# Patient Record
Sex: Female | Born: 1965 | Race: White | Hispanic: No | Smoking: Former smoker
Health system: Southern US, Community
[De-identification: ages and names within clinical notes are randomized; demographics above are authoritative.]

## PROBLEM LIST (undated history)

## (undated) DIAGNOSIS — M1990 Unspecified osteoarthritis, unspecified site: Secondary | ICD-10-CM

## (undated) DIAGNOSIS — D219 Benign neoplasm of connective and other soft tissue, unspecified: Secondary | ICD-10-CM

## (undated) HISTORY — DX: Unspecified osteoarthritis, unspecified site: M19.90

## (undated) HISTORY — PX: OTHER SURGICAL HISTORY: SHX169

## (undated) HISTORY — PX: PELVIC LAPAROSCOPY: SHX162

## (undated) HISTORY — DX: Benign neoplasm of connective and other soft tissue, unspecified: D21.9

---

## 1998-08-25 ENCOUNTER — Other Ambulatory Visit: Admission: RE | Admit: 1998-08-25 | Discharge: 1998-08-25 | Payer: Self-pay | Admitting: Gynecology

## 2000-05-16 ENCOUNTER — Ambulatory Visit (HOSPITAL_COMMUNITY): Admission: RE | Admit: 2000-05-16 | Discharge: 2000-05-16 | Payer: Self-pay | Admitting: Gynecology

## 2000-05-16 ENCOUNTER — Encounter: Payer: Self-pay | Admitting: Gynecology

## 2000-05-16 ENCOUNTER — Other Ambulatory Visit: Admission: RE | Admit: 2000-05-16 | Discharge: 2000-05-16 | Payer: Self-pay | Admitting: Gynecology

## 2000-05-19 ENCOUNTER — Encounter: Admission: RE | Admit: 2000-05-19 | Discharge: 2000-05-19 | Payer: Self-pay | Admitting: Gynecology

## 2000-05-19 ENCOUNTER — Encounter: Payer: Self-pay | Admitting: Gynecology

## 2001-05-16 ENCOUNTER — Other Ambulatory Visit: Admission: RE | Admit: 2001-05-16 | Discharge: 2001-05-16 | Payer: Self-pay | Admitting: Gynecology

## 2001-12-26 HISTORY — PX: CERVICAL BIOPSY  W/ LOOP ELECTRODE EXCISION: SUR135

## 2002-09-02 ENCOUNTER — Other Ambulatory Visit: Admission: RE | Admit: 2002-09-02 | Discharge: 2002-09-02 | Payer: Self-pay | Admitting: Gynecology

## 2003-02-28 ENCOUNTER — Other Ambulatory Visit: Admission: RE | Admit: 2003-02-28 | Discharge: 2003-02-28 | Payer: Self-pay | Admitting: Gynecology

## 2003-11-10 ENCOUNTER — Other Ambulatory Visit: Admission: RE | Admit: 2003-11-10 | Discharge: 2003-11-10 | Payer: Self-pay | Admitting: Gynecology

## 2004-11-11 ENCOUNTER — Other Ambulatory Visit: Admission: RE | Admit: 2004-11-11 | Discharge: 2004-11-11 | Payer: Self-pay | Admitting: Gynecology

## 2005-03-22 ENCOUNTER — Ambulatory Visit (HOSPITAL_COMMUNITY): Admission: RE | Admit: 2005-03-22 | Discharge: 2005-03-22 | Payer: Self-pay | Admitting: Gynecology

## 2006-10-23 ENCOUNTER — Ambulatory Visit (HOSPITAL_COMMUNITY): Admission: RE | Admit: 2006-10-23 | Discharge: 2006-10-23 | Payer: Self-pay | Admitting: Gynecology

## 2008-04-22 ENCOUNTER — Other Ambulatory Visit: Admission: RE | Admit: 2008-04-22 | Discharge: 2008-04-22 | Payer: Self-pay | Admitting: Family Medicine

## 2008-11-24 ENCOUNTER — Ambulatory Visit: Payer: Self-pay | Admitting: Gynecology

## 2009-09-11 ENCOUNTER — Encounter: Payer: Self-pay | Admitting: Women's Health

## 2009-09-11 ENCOUNTER — Other Ambulatory Visit: Admission: RE | Admit: 2009-09-11 | Discharge: 2009-09-11 | Payer: Self-pay | Admitting: Gynecology

## 2009-09-11 ENCOUNTER — Ambulatory Visit: Payer: Self-pay | Admitting: Women's Health

## 2009-09-25 ENCOUNTER — Ambulatory Visit: Payer: Self-pay | Admitting: Gynecology

## 2010-11-12 ENCOUNTER — Ambulatory Visit: Payer: Self-pay | Admitting: Women's Health

## 2010-11-12 ENCOUNTER — Other Ambulatory Visit: Admission: RE | Admit: 2010-11-12 | Discharge: 2010-11-12 | Payer: Self-pay | Admitting: Gynecology

## 2010-11-29 ENCOUNTER — Ambulatory Visit
Admission: RE | Admit: 2010-11-29 | Discharge: 2010-11-29 | Payer: Self-pay | Source: Home / Self Care | Admitting: Gynecology

## 2011-05-12 ENCOUNTER — Ambulatory Visit (HOSPITAL_COMMUNITY)
Admission: RE | Admit: 2011-05-12 | Discharge: 2011-05-12 | Disposition: A | Discharge status: Home / Self Care | Payer: PRIVATE HEALTH INSURANCE | Source: Ambulatory Visit | Attending: Gynecology | Admitting: Gynecology

## 2011-05-12 ENCOUNTER — Other Ambulatory Visit: Payer: Self-pay | Admitting: Gynecology

## 2011-05-12 DIAGNOSIS — Z1231 Encounter for screening mammogram for malignant neoplasm of breast: Secondary | ICD-10-CM | POA: Insufficient documentation

## 2011-07-02 IMAGING — CR DG PELVIS 1-2V
1 series · 1 of 1 positions shown · non-contrast
Comparison: None

CLINICAL DATA: Low back pain

PELVIS - 1-2 VIEW

[view not recorded]
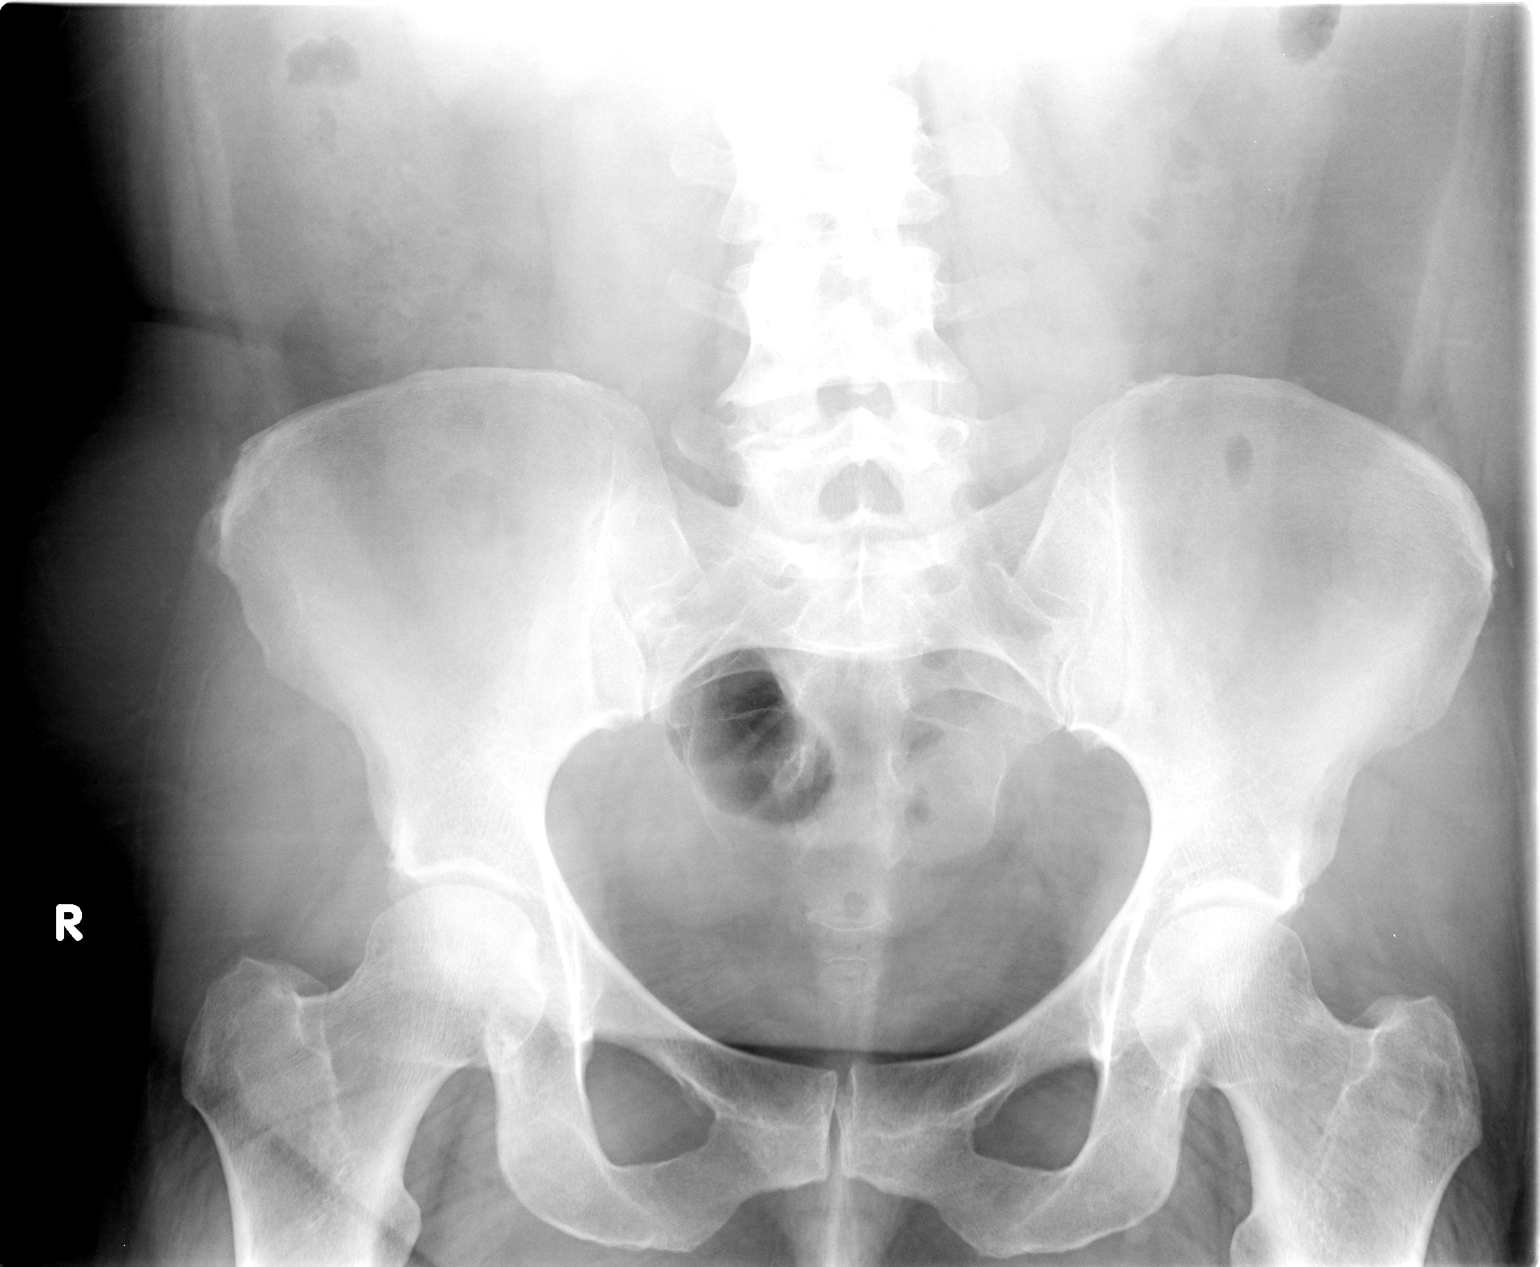

[1 of 1 positions shown; findings below may reference images not displayed]

FINDINGS: Both hips are in normal alignment.  No significant
degenerative change.  The SI joints are normal bilaterally.
Negative for pelvic fracture or bone lesion.
IMPRESSION: Negative

## 2011-12-09 ENCOUNTER — Encounter: Payer: Self-pay | Admitting: Women's Health

## 2011-12-09 ENCOUNTER — Ambulatory Visit (INDEPENDENT_AMBULATORY_CARE_PROVIDER_SITE_OTHER): Payer: PRIVATE HEALTH INSURANCE | Admitting: Women's Health

## 2011-12-09 ENCOUNTER — Other Ambulatory Visit (HOSPITAL_COMMUNITY)
Admission: RE | Admit: 2011-12-09 | Discharge: 2011-12-09 | Disposition: A | Discharge status: Home / Self Care | Payer: PRIVATE HEALTH INSURANCE | Source: Ambulatory Visit | Attending: Women's Health | Admitting: Women's Health

## 2011-12-09 VITALS — BP 120/80 | Ht 64.0 in | Wt 230.0 lb

## 2011-12-09 DIAGNOSIS — Z13220 Encounter for screening for lipoid disorders: Secondary | ICD-10-CM

## 2011-12-09 DIAGNOSIS — Z833 Family history of diabetes mellitus: Secondary | ICD-10-CM

## 2011-12-09 DIAGNOSIS — Z01419 Encounter for gynecological examination (general) (routine) without abnormal findings: Secondary | ICD-10-CM

## 2011-12-09 DIAGNOSIS — R82998 Other abnormal findings in urine: Secondary | ICD-10-CM

## 2011-12-09 DIAGNOSIS — F411 Generalized anxiety disorder: Secondary | ICD-10-CM

## 2011-12-09 DIAGNOSIS — F419 Anxiety disorder, unspecified: Secondary | ICD-10-CM

## 2011-12-09 MED ORDER — ALPRAZOLAM 0.25 MG PO TABS: 0.2500 mg | ORAL_TABLET | Freq: Every evening | ORAL | Status: DC | PRN

## 2011-12-09 NOTE — Progress Notes (Signed)
[facility] 27-Oct-1979 [id number]    History:    The patient presents for annual exam.  No complaints.   Past medical history, past surgical history, family history and social history were all reviewed and documented in the EPIC chart. 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 History of father with colon cancer. History of normal mammograms. Smoke-free greater than 6 months.  ROS:  A  ROS was performed and pertinent positives and negatives are included in the history.  Exam:  Filed Vitals:   12/09/11 0905  BP: 120/80    General appearance:  Normal Head/Neck:  Normal, without cervical or supraclavicular adenopathy. Thyroid:  Symmetrical, normal in size, without palpable masses or nodularity. Respiratory  Effort:  Normal  Auscultation:  Clear without wheezing or rhonchi Cardiovascular  Auscultation:  Regular rate, without rubs, murmurs or gallops  Edema/varicosities:  Not grossly evident Abdominal  Soft,nontender, without masses, guarding or rebound.  Liver/spleen:  No organomegaly noted  Hernia:  None appreciated  Skin  Inspection:  Grossly normal  Palpation:  Grossly normal Neurologic/psychiatric  Orientation:  Normal with appropriate conversation.  Mood/affect:  Normal  Genitourinary    Breasts: Examined lying and sitting.     Right: Without masses, retractions, discharge or axillary adenopathy.     Left: Without masses, retractions, discharge or axillary adenopathy.   Inguinal/mons:  Normal without inguinal adenopathy  External genitalia:  Normal  BUS/Urethra/Skene's glands:  Normal  Bladder:  Normal  Vagina:  Normal  Cervix:  Normal/stenotic  Uterus:  normal in size, shape and contour.  Midline and mobile  Adnexa/parametria:     Rt: Without masses or tenderness.   Lt: Without masses or tenderness.  Anus and perineum: Normal  Digital rectal exam: Normal sphincter tone without palpated masses or tenderness  Assessment/Plan:  45 y.o. MWF G1 P0  for  annual exam. Monthly 3 day cycle/not sexually active.  Normal GYN exam Father history of colon cancer Situational stressors/anxiety/impending divorce  Plan: Schedule colonoscopy. SBEs, annual screening, exercise, calcium rich diet, vitamin D 1000 ,continue to decrease calories for weight loss encouraged. Xanax 0.25 every 8 hours when necessary and for rest #30 one refill. Aware to use sparingly/addictive. Declines need for counseling at this time. CBC, glucose, lipid profile, TSH, UA and Pap.   Harrington Chall[facility], 9:48 AM 12/09/2011

## 2012-05-02 ENCOUNTER — Other Ambulatory Visit: Payer: Self-pay | Admitting: Women's Health

## 2012-05-02 DIAGNOSIS — J069 Acute upper respiratory infection, unspecified: Secondary | ICD-10-CM

## 2012-05-02 MED ORDER — AMOXICILLIN-POT CLAVULANATE 500-125 MG PO TABS: ORAL_TABLET | ORAL | Status: DC

## 2012-05-02 NOTE — Progress Notes (Signed)
Telephone call with complaint of sore throat, upper respiratory symptoms with productive cough. History of tonsillitis in the past, has quit smoking and has done better

## 2012-08-02 ENCOUNTER — Other Ambulatory Visit: Payer: Self-pay | Admitting: Gynecology

## 2012-08-02 ENCOUNTER — Ambulatory Visit (HOSPITAL_COMMUNITY)
Admission: RE | Admit: 2012-08-02 | Discharge: 2012-08-02 | Disposition: A | Discharge status: Home / Self Care | Payer: PRIVATE HEALTH INSURANCE | Source: Ambulatory Visit | Attending: Gynecology | Admitting: Gynecology

## 2012-08-02 DIAGNOSIS — Z0000 Encounter for general adult medical examination without abnormal findings: Secondary | ICD-10-CM

## 2012-08-02 DIAGNOSIS — Z1231 Encounter for screening mammogram for malignant neoplasm of breast: Secondary | ICD-10-CM | POA: Insufficient documentation

## 2012-12-21 ENCOUNTER — Encounter: Payer: Self-pay | Admitting: Women's Health

## 2014-05-30 ENCOUNTER — Other Ambulatory Visit: Payer: Self-pay | Admitting: Women's Health

## 2014-05-30 ENCOUNTER — Encounter: Payer: Self-pay | Admitting: Women's Health

## 2014-05-30 MED ORDER — VITAMIN D (ERGOCALCIFEROL) 1.25 MG (50000 UNIT) PO CAPS: 50000.0000 [IU] | ORAL_CAPSULE | ORAL | Status: DC

## 2014-06-13 ENCOUNTER — Ambulatory Visit (INDEPENDENT_AMBULATORY_CARE_PROVIDER_SITE_OTHER): Payer: BC Managed Care – PPO | Admitting: Women's Health

## 2014-06-13 ENCOUNTER — Other Ambulatory Visit: Payer: Self-pay | Admitting: Gynecology

## 2014-06-13 ENCOUNTER — Other Ambulatory Visit (HOSPITAL_COMMUNITY)
Admission: RE | Admit: 2014-06-13 | Discharge: 2014-06-13 | Disposition: A | Discharge status: Home / Self Care | Payer: BC Managed Care – PPO | Source: Ambulatory Visit | Attending: Women's Health | Admitting: Women's Health

## 2014-06-13 ENCOUNTER — Encounter: Payer: Self-pay | Admitting: Women's Health

## 2014-06-13 VITALS — BP 124/74 | Ht 64.5 in | Wt 250.0 lb

## 2014-06-13 DIAGNOSIS — Z1151 Encounter for screening for human papillomavirus (HPV): Secondary | ICD-10-CM | POA: Insufficient documentation

## 2014-06-13 DIAGNOSIS — Z01419 Encounter for gynecological examination (general) (routine) without abnormal findings: Secondary | ICD-10-CM

## 2014-06-13 DIAGNOSIS — Z1231 Encounter for screening mammogram for malignant neoplasm of breast: Secondary | ICD-10-CM

## 2014-06-13 NOTE — Progress Notes (Signed)
Mary Braun 06/11/1976 [id number]    History:    Presents for annual exam.  Monthly cycle for several days less predictable. Not sexually active for years. 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 Normal mammogram history. Scheduled for gastric sleeve for weight loss in one month.  Past medical history, past surgical history, family history and social history were all reviewed and documented in the EPIC chart. Ultrasonographer. Father colon Cancer at 67, DM, hypertension , COPD, mother A fib and COPD.  ROS:  A  12 point ROS was performed and pertinent positives and negatives are included.  Exam:  Filed Vitals:   06/13/14 1131  BP: 124/74    General appearance:  Normal Thyroid:  Symmetrical, normal in size, without palpable masses or nodularity. Respiratory  Auscultation:  Clear without wheezing or rhonchi Cardiovascular  Auscultation:  Regular rate, without rubs, murmurs or gallops  Edema/varicosities:  Not grossly evident Abdominal  Soft,nontender, without masses, guarding or rebound.  Liver/spleen:  No organomegaly noted  Hernia:  None appreciated  Skin  Inspection:  Grossly normal   Breasts: Examined lying and sitting.     Right: Without masses, retractions, discharge or axillary adenopathy.     Left: Without masses, retractions, discharge or axillary adenopathy. Gentitourinary   Inguinal/mons:  Normal without inguinal adenopathy  External genitalia:  Normal  BUS/Urethra/Skene's glands:  Normal  Vagina:  Normal  Cervix:  Normal  Uterus:  normal in size, shape and contour.  Midline and mobile  Adnexa/parametria:     Rt: Without masses or tenderness.   Lt: Without masses or tenderness.  Anus and perineum: Normal  Digital rectal exam: Normal sphincter tone without palpated masses or tenderness  Assessment/Plan:  48 y.o. DWF G0 for annual exam with no complaints.  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 Labs primary care in preparation for gastric sleeve reviewed, will repeat 6 months after weight loss. Pap with HR HPV typing. Screening colonoscopy 48.   Note: This dictation was prepared with Dragon/digital dictation.  Any transcriptional errors that result are unintentional. Huel [facility], 12:04 PM 06/13/2014

## 2014-06-13 NOTE — Patient Instructions (Signed)

## 2014-06-13 NOTE — Progress Notes (Deleted)
Patient ID: Mary Braun, female   DOB: [birth year], 48 y.o.   MRN: [id number]

## 2014-06-14 LAB — URINALYSIS W MICROSCOPIC + REFLEX CULTURE
BILIRUBIN URINE: NEGATIVE
CRYSTALS: NONE SEEN
Casts: NONE SEEN
Glucose, UA: NEGATIVE mg/dL
HGB URINE DIPSTICK: NEGATIVE
KETONES UR: NEGATIVE mg/dL
Leukocytes, UA: NEGATIVE
NITRITE: NEGATIVE
Protein, ur: NEGATIVE mg/dL
SPECIFIC GRAVITY, URINE: 1.02 (ref 1.005–1.030)
Squamous Epithelial / LPF: NONE SEEN
Urobilinogen, UA: 0.2 mg/dL (ref 0.0–1.0)
pH: 7 (ref 5.0–8.0)

## 2014-06-15 LAB — URINE CULTURE: Colony Count: 50000

## 2014-06-17 LAB — CYTOLOGY - PAP

## 2014-06-26 ENCOUNTER — Other Ambulatory Visit: Payer: Self-pay | Admitting: *Deleted

## 2014-06-26 ENCOUNTER — Other Ambulatory Visit: Payer: Self-pay | Admitting: Family Medicine

## 2014-06-26 ENCOUNTER — Ambulatory Visit (INDEPENDENT_AMBULATORY_CARE_PROVIDER_SITE_OTHER): Payer: BC Managed Care – PPO

## 2014-06-26 DIAGNOSIS — Z01811 Encounter for preprocedural respiratory examination: Secondary | ICD-10-CM

## 2014-06-26 DIAGNOSIS — Z1231 Encounter for screening mammogram for malignant neoplasm of breast: Secondary | ICD-10-CM

## 2014-10-07 ENCOUNTER — Other Ambulatory Visit: Payer: Self-pay

## 2014-10-07 NOTE — Telephone Encounter (Signed)
Ok to refill, but have her check vit d level.

## 2014-10-08 ENCOUNTER — Other Ambulatory Visit: Payer: BC Managed Care – PPO

## 2014-10-08 ENCOUNTER — Other Ambulatory Visit: Payer: Self-pay | Admitting: *Deleted

## 2014-10-08 DIAGNOSIS — E559 Vitamin D deficiency, unspecified: Secondary | ICD-10-CM

## 2014-10-08 MED ORDER — VITAMIN D (ERGOCALCIFEROL) 1.25 MG (50000 UNIT) PO CAPS: 50000.0000 [IU] | ORAL_CAPSULE | ORAL | Status: DC

## 2014-10-08 NOTE — Telephone Encounter (Signed)
I called Pam and left a message regarding needs to have Vit D level drawn.

## 2014-10-09 ENCOUNTER — Encounter: Payer: Self-pay | Admitting: Women's Health

## 2014-10-09 LAB — VITAMIN D 25 HYDROXY (VIT D DEFICIENCY, FRACTURES): VIT D 25 HYDROXY: 45 ng/mL (ref 30–89)

## 2014-10-10 ENCOUNTER — Other Ambulatory Visit: Payer: Self-pay | Admitting: Women's Health

## 2014-10-10 MED ORDER — AMOXICILLIN 500 MG PO CAPS: 500.0000 mg | ORAL_CAPSULE | Freq: Three times a day (TID) | ORAL | Status: DC

## 2014-10-27 ENCOUNTER — Encounter: Payer: Self-pay | Admitting: Women's Health

## 2015-01-27 IMAGING — CR DG CHEST 2V
2 series · 2 of 2 positions shown · non-contrast
Comparison: None.

CLINICAL DATA: Preoperative chest x-ray.

EXAM:
CHEST  2 VIEW

[view not recorded (1 of 2)]
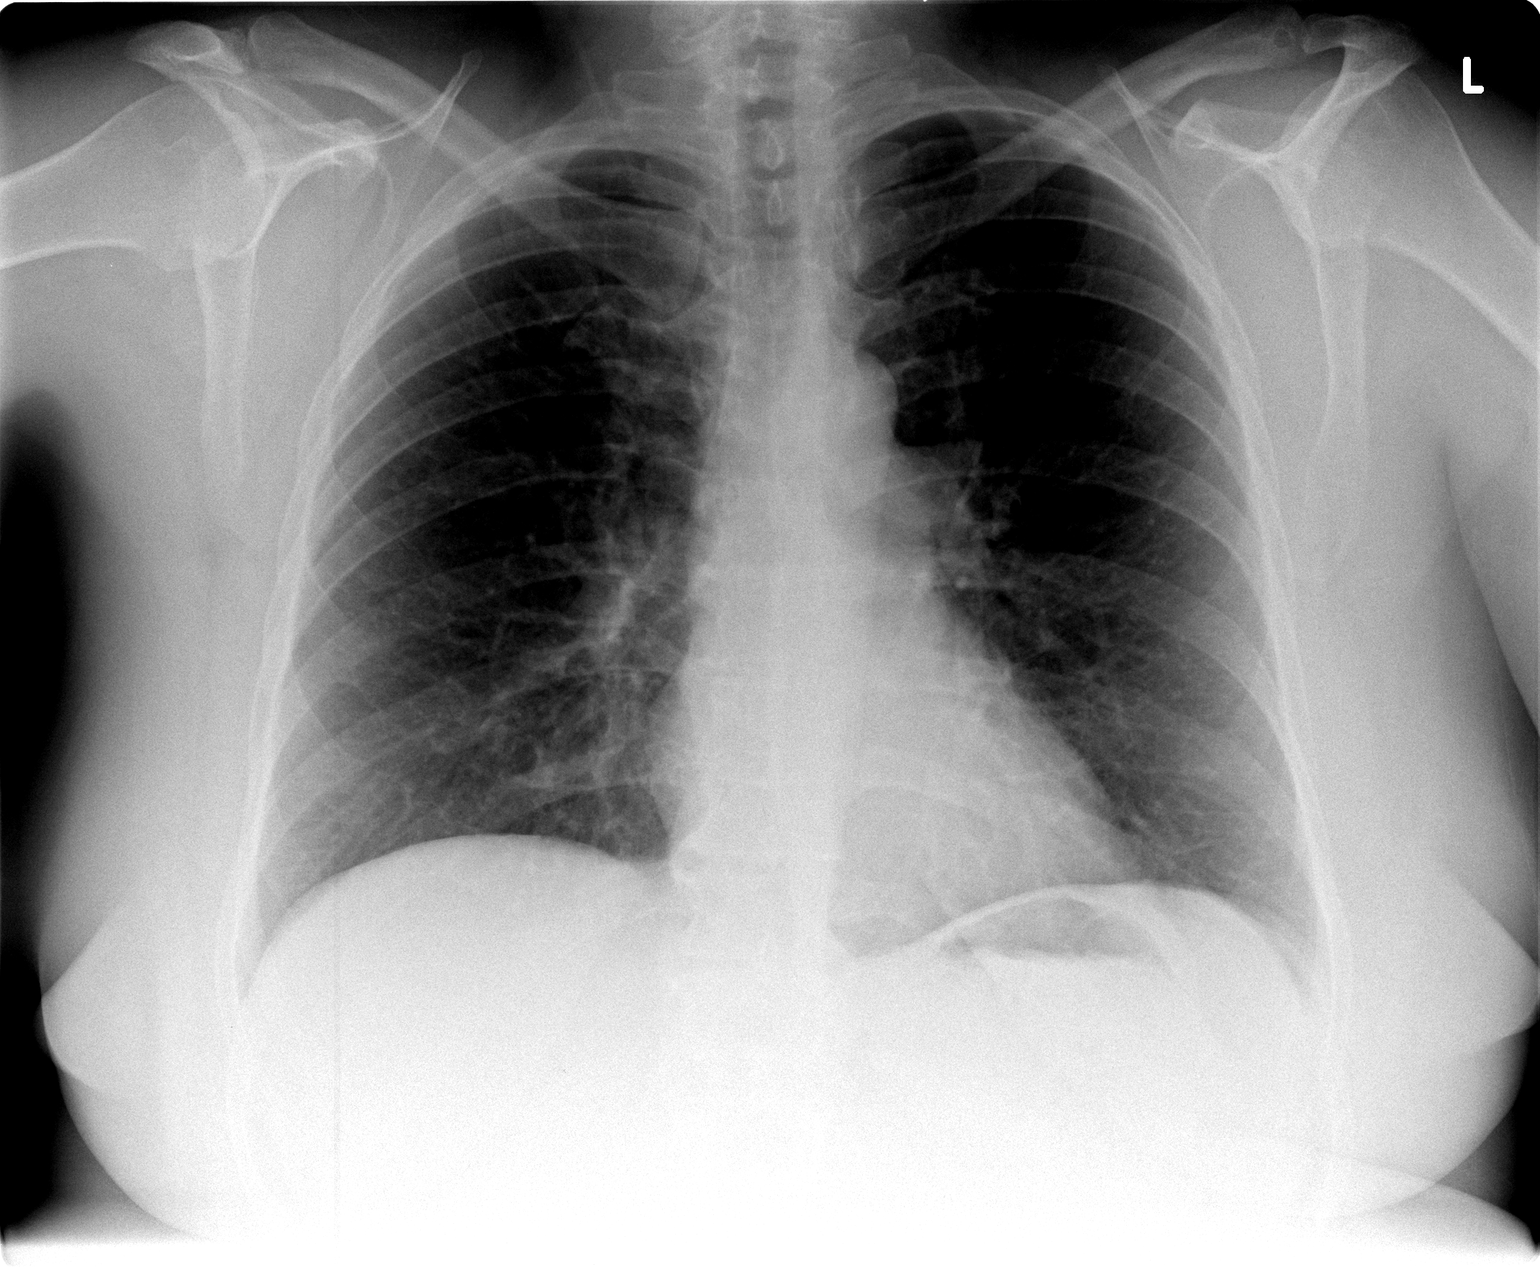

[view not recorded (2 of 2)]
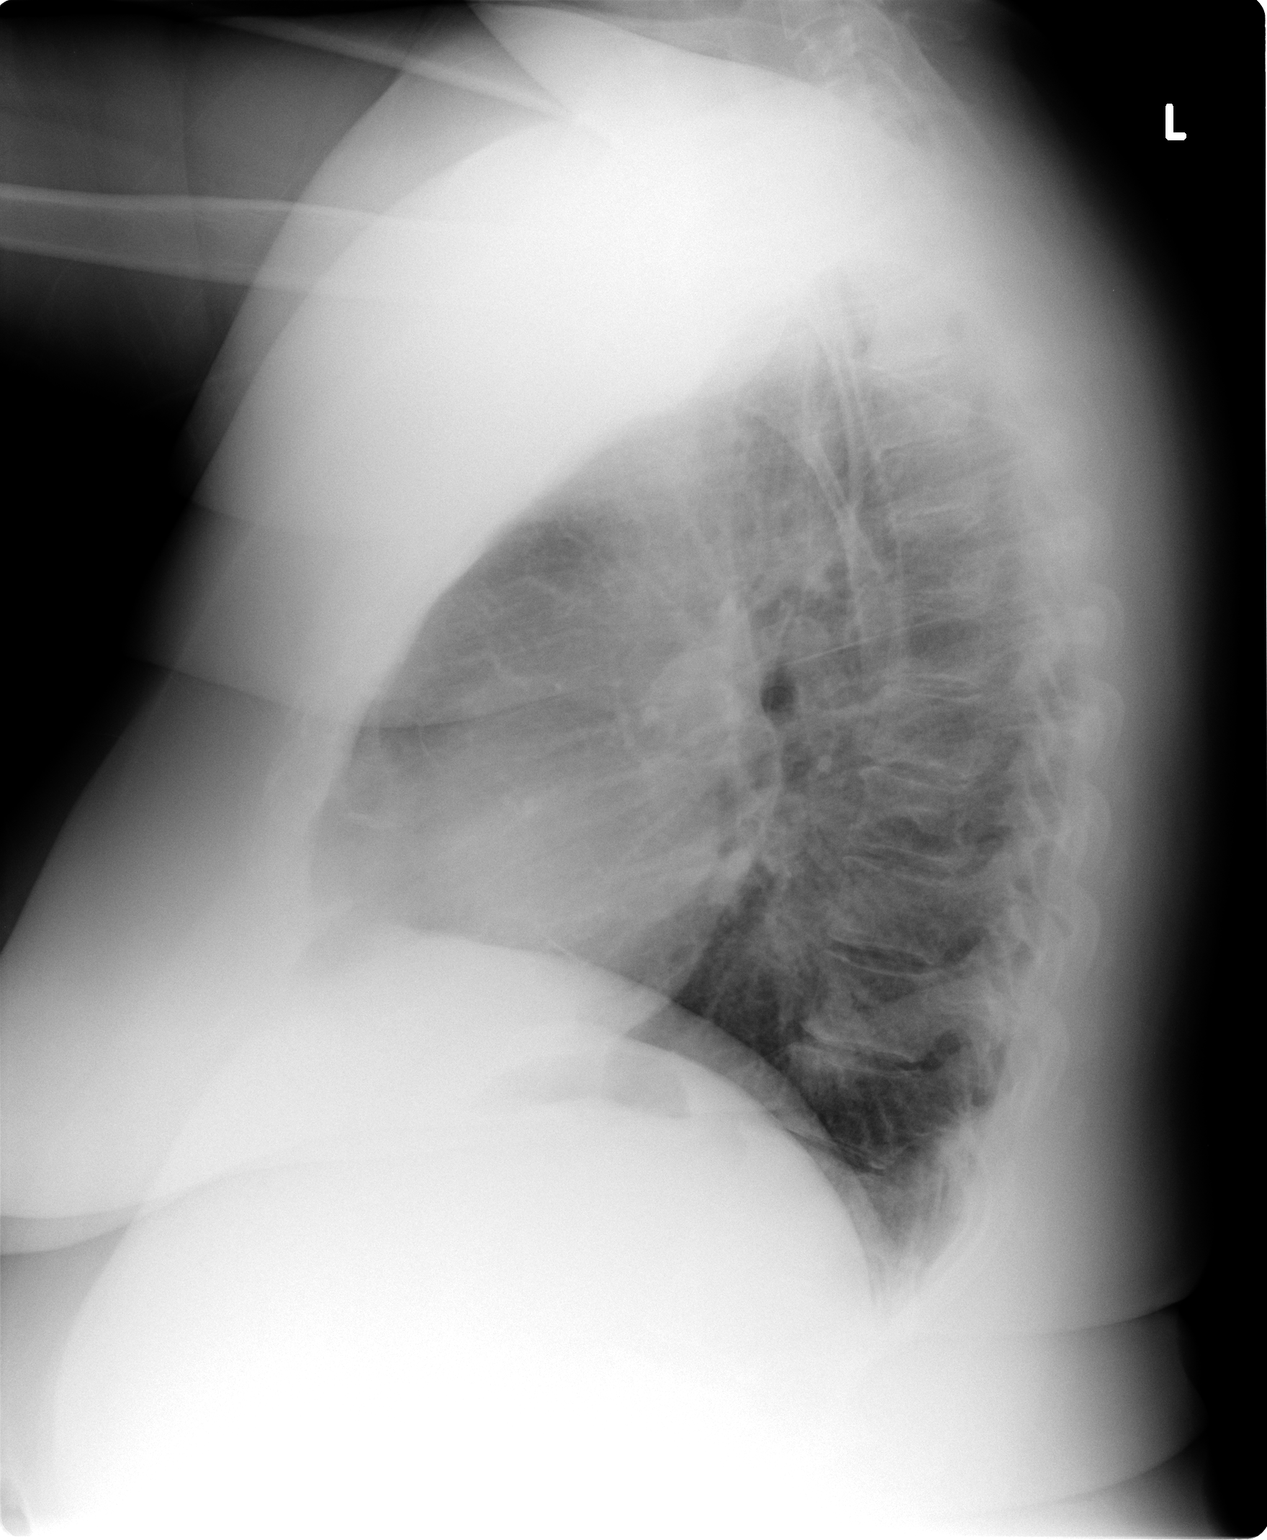

[2 of 2 positions shown; findings below may reference images not displayed]

FINDINGS: The lungs are well-expanded and clear. The heart and mediastinal
structures are normal. There is no pleural effusion. The bony thorax
is unremarkable.
IMPRESSION: There is no active cardiopulmonary disease.

## 2015-10-02 ENCOUNTER — Encounter: Payer: Self-pay | Admitting: Women's Health

## 2015-10-02 ENCOUNTER — Ambulatory Visit (INDEPENDENT_AMBULATORY_CARE_PROVIDER_SITE_OTHER): Payer: PRIVATE HEALTH INSURANCE | Admitting: Women's Health

## 2015-10-02 VITALS — BP 112/76 | Ht 64.0 in | Wt 179.0 lb

## 2015-10-02 DIAGNOSIS — Z01419 Encounter for gynecological examination (general) (routine) without abnormal findings: Secondary | ICD-10-CM

## 2015-10-02 DIAGNOSIS — G4700 Insomnia, unspecified: Secondary | ICD-10-CM | POA: Diagnosis not present

## 2015-10-02 DIAGNOSIS — E559 Vitamin D deficiency, unspecified: Secondary | ICD-10-CM

## 2015-10-02 DIAGNOSIS — B009 Herpesviral infection, unspecified: Secondary | ICD-10-CM | POA: Diagnosis not present

## 2015-10-02 MED ORDER — VITAMIN D (ERGOCALCIFEROL) 1.25 MG (50000 UNIT) PO CAPS: 50000.0000 [IU] | ORAL_CAPSULE | ORAL | Status: DC

## 2015-10-02 MED ORDER — ZOLPIDEM TARTRATE 10 MG PO TABS: 10.0000 mg | ORAL_TABLET | Freq: Every evening | ORAL | Status: DC | PRN

## 2015-10-02 MED ORDER — VALACYCLOVIR HCL 500 MG PO TABS: ORAL_TABLET | ORAL | Status: DC

## 2015-10-02 NOTE — Patient Instructions (Signed)
Health Maintenance, Female Adopting a healthy lifestyle and getting preventive care can go a long way to promote health and wellness. Talk with your health care provider about what schedule of regular examinations is right for you. This is a good chance for you to check in with your provider about disease prevention and staying healthy. In between checkups, there are plenty of things you can do on your own. Experts have done a lot of research about which lifestyle changes and preventive measures are most likely to keep you healthy. Ask your health care provider for more information. WEIGHT AND DIET  Eat a healthy diet Be sure to include plenty of vegetables, fruits, low-fat dairy products, and leaInsomnia Insomnia is a sleep disorder that makes it difficult to fall asleep or to stay asleep. Insomnia can cause tiredness (fatigue), low energy, difficulty concentrating, mood swings, and poor performance at work or school.  There are three different ways to classify insomnia: Difficulty falling asleep. Difficulty staying asleep. Waking up too early in the morning. Any type of insomnia can be long-term (chronic) or short-term (acute). Both are common. Short-term insomnia usually lasts for three months or less. Chronic insomnia occurs at least three times a week for longer than three months. CAUSES  Insomnia may be caused by another condition, situation, or substance, such as: Anxiety. Certain medicines. Gastroesophageal reflux disease (GERD) or other gastrointestinal conditions. Asthma or other breathing conditions. Restless legs syndrome, sleep apnea, or other sleep disorders. Chronic pain. Menopause. This may include hot flashes. Stroke. Abuse of alcohol, tobacco, or illegal drugs. Depression. Caffeine.  Neurological disorders, such as Alzheimer disease. An overactive thyroid (hyperthyroidism). The cause of insomnia may not be known. RISK FACTORS Risk factors for insomnia  include: Gender. Women are more commonly affected than men. Age. Insomnia is more common as you get older. Stress. This may involve your professional or personal life. Income. Insomnia is more common in people with lower income. Lack of exercise.  Irregular work schedule or night shifts. Traveling between different time zones. SIGNS AND SYMPTOMS If you have insomnia, trouble falling asleep or trouble staying asleep is the main symptom. This may lead to other symptoms, such as: Feeling fatigued. Feeling nervous about going to sleep. Not feeling rested in the morning. Having trouble concentrating. Feeling irritable, anxious, or depressed. TREATMENT  Treatment for insomnia depends on the cause. If your insomnia is caused by an underlying condition, treatment will focus on addressing the condition. Treatment may also include:  Medicines to help you sleep. Counseling or therapy. Lifestyle adjustments. HOME CARE INSTRUCTIONS  Take medicines only as directed by your health care provider. Keep regular sleeping and waking hours. Avoid naps. Keep a sleep diary to help you and your health care provider figure out what could be causing your insomnia. Include:  When you sleep. When you wake up during the night. How well you sleep.  How rested you feel the next day. Any side effects of medicines you are taking. What you eat and drink.  Make your bedroom a comfortable place where it is easy to fall asleep: Put up shades or special blackout curtains to block light from outside. Use a white noise machine to block noise. Keep the temperature cool.  Exercise regularly as directed by your health care provider. Avoid exercising right before bedtime. Use relaxation techniques to manage stress. Ask your health care provider to suggest some techniques that may work well for you. These may include: Breathing exercises. Routines to release muscle tension. Visualizing  peaceful scenes. Cut back on  alcohol, caffeinated beverages, and cigarettes, especially close to bedtime. These can disrupt your sleep. Do not overeat or eat spicy foods right before bedtime. This can lead to digestive discomfort that can make it hard for you to sleep. Limit screen use before bedtime. This includes: Watching TV. Using your smartphone, tablet, and computer. Stick to a routine. This can help you fall asleep faster. Try to do a quiet activity, brush your teeth, and go to bed at the same time each night. Get out of bed if you are still awake after 15 minutes of trying to sleep. Keep the lights down, but try reading or doing a quiet activity. When you feel sleepy, go back to bed. Make sure that you drive carefully. Avoid driving if you feel very sleepy. Keep all follow-up appointments as directed by your health care provider. This is important. SEEK MEDICAL CARE IF:  You are tired throughout the day or have trouble in your daily routine due to sleepiness. You continue to have sleep problems or your sleep problems get worse. SEEK IMMEDIATE MEDICAL CARE IF:  You have serious thoughts about hurting yourself or someone else.   This information is not intended to replace advice given to you by your health care provider. Make sure you discuss any questions you have with your health care provider.   Document Released: 12/09/2000 Document Revised: 09/02/2015 Document Reviewed: 09/12/2014 Elsevier Interactive Patient Education [employer].  n protein.  Do not eat a lot of foods high in solid fats, added sugars, or salt.  Get regular exercise. This is one of the most important things you can do for your health.  Most adults should exercise for at least 150 minutes each week. The exercise should increase your heart rate and make you sweat (moderate-intensity exercise).  Most adults should also do strengthening exercises at least twice a week. This is in addition to the moderate-intensity exercise.  Maintain  a healthy weight  Body mass index (BMI) is a measurement that can be used to identify possible weight problems. It estimates body fat based on height and weight. Your health care provider can help determine your BMI and help you achieve or maintain a healthy weight.  For females 32 years of age and older:   A BMI below 18.5 is considered underweight.  A BMI of 18.5 to 24.9 is normal.  A BMI of 25 to 29.9 is considered overweight.  A BMI of 30 and above is considered obese.  Watch levels of cholesterol and blood lipids  You should start having your blood tested for lipids and cholesterol at 51 years of age, then have this test every 5 years.  You may need to have your cholesterol levels checked more often if:  Your lipid or cholesterol levels are high.  You are older than 51 years of age.  You are at high risk for heart disease.  CANCER SCREENING   Lung Cancer  Lung cancer screening is recommended for adults 59-18 years old who are at high risk for lung cancer because of a history of smoking.  A yearly low-dose CT scan of the lungs is recommended for people who:  Currently smoke.  Have quit within the past 15 years.  Have at least a 30-pack-year history of smoking. A pack year is smoking an average of one pack of cigarettes a day for 1 year.  Yearly screening should continue until it has been 15 years since you quit.  Yearly screening should stop if you develop a health problem that would prevent you from having lung cancer treatment.  Breast Cancer  Practice breast self-awareness. This means understanding how your breasts normally appear and feel.  It also means doing regular breast self-exams. Let your health care provider know about any changes, no matter how small.  If you are in your 20s or 30s, you should have a clinical breast exam (CBE) by a health care provider every 1-3 years as part of a regular health exam.  If you are 50 or older, have a CBE every  year. Also consider having a breast X-ray (mammogram) every year.  If you have a family history of breast cancer, talk to your health care provider about genetic screening.  If you are at high risk for breast cancer, talk to your health care provider about having an MRI and a mammogram every year.  Breast cancer gene (BRCA) assessment is recommended for women who have family members with BRCA-related cancers. BRCA-related cancers include:  Breast.  Ovarian.  Tubal.  Peritoneal cancers.  Results of the assessment will determine the need for genetic counseling and BRCA1 and BRCA2 testing. Cervical Cancer Your health care provider may recommend that you be screened regularly for cancer of the pelvic organs (ovaries, uterus, and vagina). This screening involves a pelvic examination, including checking for microscopic changes to the surface of your cervix (Pap test). You may be encouraged to have this screening done every 3 years, beginning at age 24.  For women ages 60-65, health care providers may recommend pelvic exams and Pap testing every 3 years, or they may recommend the Pap and pelvic exam, combined with testing for human papilloma virus (HPV), every 5 years. Some types of HPV increase your risk of cervical cancer. Testing for HPV may also be done on women of any age with unclear Pap test results.  Other health care providers may not recommend any screening for nonpregnant women who are considered low risk for pelvic cancer and who do not have symptoms. Ask your health care provider if a screening pelvic exam is right for you.  If you have had past treatment for cervical cancer or a condition that could lead to cancer, you need Pap tests and screening for cancer for at least 20 years after your treatment. If Pap tests have been discontinued, your risk factors (such as having a new sexual partner) need to be reassessed to determine if screening should resume. Some women have medical  problems that increase the chance of getting cervical cancer. In these cases, your health care provider may recommend more frequent screening and Pap tests. Colorectal Cancer  This type of cancer can be detected and often prevented.  Routine colorectal cancer screening usually begins at 51 years of age and continues through 51 years of age.  Your health care provider may recommend screening at an earlier age if you have risk factors for colon cancer.  Your health care provider may also recommend using home test kits to check for hidden blood in the stool.  A small camera at the end of a tube can be used to examine your colon directly (sigmoidoscopy or colonoscopy). This is done to check for the earliest forms of colorectal cancer.  Routine screening usually begins at age 49.  Direct examination of the colon should be repeated every 5-10 years through 51 years of age. However, you may need to be screened more often if early forms of precancerous polyps or  small growths are found. Skin Cancer  Check your skin from head to toe regularly.  Tell your health care provider about any new moles or changes in moles, especially if there is a change in a mole's shape or color.  Also tell your health care provider if you have a mole that is larger than the size of a pencil eraser.  Always use sunscreen. Apply sunscreen liberally and repeatedly throughout the day.  Protect yourself by wearing long sleeves, pants, a wide-brimmed hat, and sunglasses whenever you are outside. HEART DISEASE, DIABETES, AND HIGH BLOOD PRESSURE   High blood pressure causes heart disease and increases the risk of stroke. High blood pressure is more likely to develop in:  People who have blood pressure in the high end of the normal range (130-139/85-89 mm Hg).  People who are overweight or obese.  People who are African American.  If you are 2-93 years of age, have your blood pressure checked every 3-5 years. If you  are 22 years of age or older, have your blood pressure checked every year. You should have your blood pressure measured twice--once when you are at a hospital or clinic, and once when you are not at a hospital or clinic. Record the average of the two measurements. To check your blood pressure when you are not at a hospital or clinic, you can use:  An automated blood pressure machine at a pharmacy.  A home blood pressure monitor.  If you are between 29 years and 45 years old, ask your health care provider if you should take aspirin to prevent strokes.  Have regular diabetes screenings. This involves taking a blood sample to check your fasting blood sugar level.  If you are at a normal weight and have a low risk for diabetes, have this test once every three years after 51 years of age.  If you are overweight and have a high risk for diabetes, consider being tested at a younger age or more often. PREVENTING INFECTION  Hepatitis B  If you have a higher risk for hepatitis B, you should be screened for this virus. You are considered at high risk for hepatitis B if:  You were born in a country where hepatitis B is common. Ask your health care provider which countries are considered high risk.  Your parents were born in a high-risk country, and you have not been immunized against hepatitis B (hepatitis B vaccine).  You have HIV or AIDS.  You use needles to inject street drugs.  You live with someone who has hepatitis B.  You have had sex with someone who has hepatitis B.  You get hemodialysis treatment.  You take certain medicines for conditions, including cancer, organ transplantation, and autoimmune conditions. Hepatitis C  Blood testing is recommended for:  Everyone born from [birth year] through 1967.  Anyone with known risk factors for hepatitis C. Sexually transmitted infections (STIs)  You should be screened for sexually transmitted infections (STIs) including gonorrhea and chlamydia  if:  You are sexually active and are younger than 51 years of age.  You are older than 51 years of age and your health care provider tells you that you are at risk for this type of infection.  Your sexual activity has changed since you were last screened and you are at an increased risk for chlamydia or gonorrhea. Ask your health care provider if you are at risk.  If you do not have HIV, but are at risk, it may be  recommended that you take a prescription medicine daily to prevent HIV infection. This is called pre-exposure prophylaxis (PrEP). You are considered at risk if:  You are sexually active and do not regularly use condoms or know the HIV status of your partner(s).  You take drugs by injection.  You are sexually active with a partner who has HIV. Talk with your health care provider about whether you are at high risk of being infected with HIV. If you choose to begin PrEP, you should first be tested for HIV. You should then be tested every 3 months for as long as you are taking PrEP.  PREGNANCY   If you are premenopausal and you may become pregnant, ask your health care provider about preconception counseling.  If you may become pregnant, take 400 to 800 micrograms (mcg) of folic acid every day.  If you want to prevent pregnancy, talk to your health care provider about birth control (contraception). OSTEOPOROSIS AND MENOPAUSE   Osteoporosis is a disease in which the bones lose minerals and strength with aging. This can result in serious bone fractures. Your risk for osteoporosis can be identified using a bone density scan.  If you are 30 years of age or older, or if you are at risk for osteoporosis and fractures, ask your health care provider if you should be screened.  Ask your health care provider whether you should take a calcium or vitamin D supplement to lower your risk for osteoporosis.  Menopause may have certain physical symptoms and risks.  Hormone replacement therapy  may reduce some of these symptoms and risks. Talk to your health care provider about whether hormone replacement therapy is right for you.  HOME CARE INSTRUCTIONS   Schedule regular health, dental, and eye exams.  Stay current with your immunizations.   Do not use any tobacco products including cigarettes, chewing tobacco, or electronic cigarettes.  If you are pregnant, do not drink alcohol.  If you are breastfeeding, limit how much and how often you drink alcohol.  Limit alcohol intake to no more than 1 drink per day for nonpregnant women. One drink equals 12 ounces of beer, 5 ounces of wine, or 1 ounces of hard liquor.  Do not use street drugs.  Do not share needles.  Ask your health care provider for help if you need support or information about quitting drugs.  Tell your health care provider if you often feel depressed.  Tell your health care provider if you have ever been abused or do not feel safe at home.   This information is not intended to replace advice given to you by your health care provider. Make sure you discuss any questions you have with your health care provider.   Document Released: 06/27/2011 Document Revised: 01/02/2015 Document Reviewed: 11/13/2013 Elsevier Interactive Patient Education [employer].

## 2015-10-02 NOTE — Progress Notes (Signed)
[facility] [birth year] [id number]    History:    Presents for annual exam.  Continues to have monthly cycles. Not sexually active. Has had an 80 pound weight gain since gastric sleeve 2015, doing well. Normal Pap history. 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 Normal mammogram history. 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  Exam:  Filed Vitals:   10/02/15 1152  BP: 112/76    General appearance:  Normal Thyroid:  Symmetrical, normal in size, without palpable masses or nodularity. Respiratory  Auscultation:  Clear without wheezing or rhonchi Cardiovascular  Auscultation:  Regular rate, without rubs, murmurs or gallops  Edema/varicosities:  Not grossly evident Abdominal  Soft,nontender, without masses, guarding or rebound.  Liver/spleen:  No organomegaly noted  Hernia:  None appreciated  Skin  Inspection:  Grossly normal   Breasts: Examined lying and sitting.     Right: Without masses, retractions, discharge or axillary adenopathy.     Left: Without masses, retractions, discharge or axillary adenopathy. Gentitourinary   Inguinal/mons:  Normal without inguinal adenopathy  External genitalia:  Normal  BUS/Urethra/Skene's glands:  Normal  Vagina:  Normal  Cervix:  Normal  Uterus:   normal in size, shape and contour.  Midline and mobile  Adnexa/parametria:     Rt: Without masses or tenderness.   Lt: Without masses or tenderness.  Anus and perineum: Normal  Digital rectal exam: Normal sphincter tone without palpated masses or tenderness  Assessment/Plan:  49 y.o. D WF G0 annual exam with complaint of poor sleep  Monthly cycle/not sexually active 80 pound weight loss after gastric sleeve doing well Normal labs at health screening at work Insomnia  Plan:  Continue regular exercise, calcium rich diet, vitamin D 50,000 weekly for 12 weeks and then 2000 daily last vitamin D level 30, . Excellent lipid panel, normal glucose, CBC, TSH, at primary care. Pap normal with negative HR HPV 2015, new screening guidelines reviewed. UA. Declines need for contraception. Menopause reviewed. Congratulated on healthy lifestyle. Ambien 10 mg half tablet to one daily at bedtime when necessary #30, reviewed sleep hygiene, addictive properties of Ambien use sparingly.    Huel Cote WHNP, 3:20 PM 10/02/2015

## 2015-11-25 ENCOUNTER — Other Ambulatory Visit: Payer: Self-pay | Admitting: *Deleted

## 2015-11-25 DIAGNOSIS — G4700 Insomnia, unspecified: Secondary | ICD-10-CM

## 2015-11-25 MED ORDER — ZOLPIDEM TARTRATE 10 MG PO TABS: 10.0000 mg | ORAL_TABLET | Freq: Every evening | ORAL | Status: DC | PRN

## 2015-11-25 NOTE — Telephone Encounter (Signed)
OVER DUE FOR ANNUAL

## 2015-11-25 NOTE — Telephone Encounter (Signed)
Rx called in 

## 2015-12-09 ENCOUNTER — Other Ambulatory Visit: Payer: Self-pay | Admitting: Gynecology

## 2015-12-09 DIAGNOSIS — Z139 Encounter for screening, unspecified: Secondary | ICD-10-CM

## 2015-12-16 ENCOUNTER — Ambulatory Visit: Payer: PRIVATE HEALTH INSURANCE

## 2015-12-23 ENCOUNTER — Ambulatory Visit (INDEPENDENT_AMBULATORY_CARE_PROVIDER_SITE_OTHER): Payer: PRIVATE HEALTH INSURANCE

## 2015-12-23 DIAGNOSIS — Z1231 Encounter for screening mammogram for malignant neoplasm of breast: Secondary | ICD-10-CM

## 2015-12-23 DIAGNOSIS — Z139 Encounter for screening, unspecified: Secondary | ICD-10-CM

## 2016-07-08 ENCOUNTER — Other Ambulatory Visit: Payer: Self-pay | Admitting: Women's Health

## 2016-07-08 DIAGNOSIS — G4700 Insomnia, unspecified: Secondary | ICD-10-CM

## 2016-07-08 MED ORDER — ZOLPIDEM TARTRATE 5 MG PO TABS: 5.0000 mg | ORAL_TABLET | Freq: Every evening | ORAL | Status: DC | PRN

## 2017-05-10 ENCOUNTER — Encounter: Payer: Self-pay | Admitting: Gynecology

## 2017-10-11 ENCOUNTER — Ambulatory Visit (INDEPENDENT_AMBULATORY_CARE_PROVIDER_SITE_OTHER): Payer: PRIVATE HEALTH INSURANCE | Admitting: Women's Health

## 2017-10-11 ENCOUNTER — Encounter: Payer: Self-pay | Admitting: Women's Health

## 2017-10-11 ENCOUNTER — Telehealth: Payer: Self-pay | Admitting: *Deleted

## 2017-10-11 VITALS — BP 124/80 | Ht 64.0 in | Wt 185.0 lb

## 2017-10-11 DIAGNOSIS — Z01419 Encounter for gynecological examination (general) (routine) without abnormal findings: Secondary | ICD-10-CM | POA: Diagnosis not present

## 2017-10-11 DIAGNOSIS — B009 Herpesviral infection, unspecified: Secondary | ICD-10-CM

## 2017-10-11 DIAGNOSIS — F419 Anxiety disorder, unspecified: Secondary | ICD-10-CM

## 2017-10-11 DIAGNOSIS — E559 Vitamin D deficiency, unspecified: Secondary | ICD-10-CM

## 2017-10-11 DIAGNOSIS — F5101 Primary insomnia: Secondary | ICD-10-CM | POA: Diagnosis not present

## 2017-10-11 DIAGNOSIS — L237 Allergic contact dermatitis due to plants, except food: Secondary | ICD-10-CM | POA: Diagnosis not present

## 2017-10-11 DIAGNOSIS — Z01411 Encounter for gynecological examination (general) (routine) with abnormal findings: Secondary | ICD-10-CM

## 2017-10-11 MED ORDER — VALACYCLOVIR HCL 500 MG PO TABS: ORAL_TABLET | ORAL | 6 refills | Status: DC

## 2017-10-11 MED ORDER — PREDNISONE 10 MG (21) PO TBPK: ORAL_TABLET | ORAL | 0 refills | Status: AC

## 2017-10-11 MED ORDER — VITAMIN D (ERGOCALCIFEROL) 1.25 MG (50000 UNIT) PO CAPS: 50000.0000 [IU] | ORAL_CAPSULE | ORAL | 1 refills | Status: DC

## 2017-10-11 MED ORDER — ZOLPIDEM TARTRATE 5 MG PO TABS
5.0000 mg | ORAL_TABLET | Freq: Every evening | ORAL | 2 refills | Status: DC | PRN
Start: 2017-10-11 — End: 2019-01-29

## 2017-10-11 MED ORDER — ALPRAZOLAM 0.25 MG PO TABS: 0.2500 mg | ORAL_TABLET | Freq: Every evening | ORAL | 1 refills | Status: DC | PRN

## 2017-10-11 NOTE — Telephone Encounter (Signed)
Telephone call to pharmacy to verify yes that dosing is fine.

## 2017-10-11 NOTE — Progress Notes (Signed)
Mary Braun Feb 04, 1966 [id number]    History: 51 year old SWF G1P0 menopausal on HRT with light irregular periods presents for annual exam. History of gastric sleeve surgery and 90  lb sustained weight loss, followed by GI - seen last month and all normal. HSV - rare outbreaks. Hot flushes are well controlled with estradiol patch and Provera per prima. Uses Xanax and Ambien for sleep as needed. Recently exposed to poison ivy and has rash over neck and upper/lower extremities. Taking Vitamin D 50000 U. Needs refills on all medications. 2017 normal DEXA.  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  Exam:  Vitals:   10/11/17 1115  BP: 124/80  Weight: 185 lb (83.9 kg)  Height: 5\' 4"  (1.626 m)   Body mass index is 31.76 kg/m.   General appearance:  Normal Thyroid:  Symmetrical, normal in size, without palpable masses or nodularity. Respiratory  Auscultation:  Clear without wheezing or rhonchi Cardiovascular  Auscultation:  Regular rate, without rubs, murmurs or gallops  Edema/varicosities:  Not grossly evident Abdominal  Soft,nontender, without masses, guarding or rebound.  Liver/spleen:  No organomegaly noted  Hernia:  None appreciated  Skin  Inspection:  Grossly normal   Breasts: Examined lying and sitting.     Right: Without masses, retractions, discharge or axillary adenopathy.     Left: Without masses, retractions, discharge or axillary adenopathy. Gentitourinary   Inguinal/mons:  Normal without inguinal adenopathy  External genitalia:  Normal  BUS/Urethra/Skene's glands:  Normal  Vagina:  Normal  Cervix:  Normal  Uterus:  normal in size, shape and contour.  Midline and mobile  Adnexa/parametria:     Rt: Without masses or tenderness.   Lt: Without masses or tenderness.  Anus and  perineum: Normal  Digital rectal exam: Normal sphincter tone without palpated masses or tenderness  Assessment/Plan:  51 y.o. D WF G1 P0 for annual exam with no complaints.  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 SBE's, continue annual screening mammogram overdue , reviewed importance of annual screen, instructed to schedule. Exercise, calcium rich diet, vitamin D 50,000 IUs weekly. Recheck vitamin D in 12 weeks. Valtrex 500 mg twice daily for 3-5 days as needed. Ambien 5 mg at bedtime for sleep, reviewed sleep hygiene, addictive and use sparingly. Xanax 0.25 prescription, proper use given and reviewed addictive properties, does use sparingly.Pap with HR PV typing, new screening guidelines reviewed.    [place], 12:01 PM 10/11/2017

## 2017-10-11 NOTE — Telephone Encounter (Signed)
Christy (pharmacist) called wanted to confirm that you wanted Prednisone 21 pack if so the directions should be taper dose starting with 6 first day then 5, 4, 3,2, then 1 until finish. You do want this correct?

## 2017-10-11 NOTE — Patient Instructions (Signed)

## 2017-10-12 LAB — PAP, TP IMAGING W/ HPV RNA, RFLX HPV TYPE 16,18/45: HPV DNA High Risk: NOT DETECTED

## 2017-11-20 ENCOUNTER — Other Ambulatory Visit: Payer: Self-pay | Admitting: Women's Health

## 2017-11-20 DIAGNOSIS — Z1231 Encounter for screening mammogram for malignant neoplasm of breast: Secondary | ICD-10-CM

## 2017-11-30 ENCOUNTER — Ambulatory Visit: Payer: PRIVATE HEALTH INSURANCE

## 2017-12-07 ENCOUNTER — Ambulatory Visit: Payer: PRIVATE HEALTH INSURANCE

## 2017-12-28 ENCOUNTER — Ambulatory Visit (INDEPENDENT_AMBULATORY_CARE_PROVIDER_SITE_OTHER): Payer: PRIVATE HEALTH INSURANCE

## 2017-12-28 ENCOUNTER — Encounter: Payer: Self-pay | Admitting: Women's Health

## 2017-12-28 ENCOUNTER — Ambulatory Visit: Payer: PRIVATE HEALTH INSURANCE

## 2017-12-28 DIAGNOSIS — Z1231 Encounter for screening mammogram for malignant neoplasm of breast: Secondary | ICD-10-CM

## 2019-01-29 ENCOUNTER — Other Ambulatory Visit: Payer: Self-pay | Admitting: Women's Health

## 2019-01-29 DIAGNOSIS — F5101 Primary insomnia: Secondary | ICD-10-CM

## 2019-01-29 DIAGNOSIS — B009 Herpesviral infection, unspecified: Secondary | ICD-10-CM

## 2019-01-29 MED ORDER — ZOLPIDEM TARTRATE 5 MG PO TABS: 5.0000 mg | ORAL_TABLET | Freq: Every evening | ORAL | 2 refills | Status: DC | PRN

## 2019-01-29 MED ORDER — VALACYCLOVIR HCL 500 MG PO TABS: ORAL_TABLET | ORAL | 6 refills | Status: DC

## 2019-09-16 ENCOUNTER — Other Ambulatory Visit: Payer: Self-pay

## 2019-09-17 ENCOUNTER — Ambulatory Visit: Payer: PRIVATE HEALTH INSURANCE | Admitting: Women's Health

## 2019-09-17 ENCOUNTER — Encounter: Payer: Self-pay | Admitting: Women's Health

## 2019-09-17 DIAGNOSIS — F419 Anxiety disorder, unspecified: Secondary | ICD-10-CM | POA: Diagnosis not present

## 2019-09-17 DIAGNOSIS — F5101 Primary insomnia: Secondary | ICD-10-CM

## 2019-09-17 DIAGNOSIS — Z01419 Encounter for gynecological examination (general) (routine) without abnormal findings: Secondary | ICD-10-CM | POA: Diagnosis not present

## 2019-09-17 DIAGNOSIS — E559 Vitamin D deficiency, unspecified: Secondary | ICD-10-CM

## 2019-09-17 DIAGNOSIS — Z9884 Bariatric surgery status: Secondary | ICD-10-CM | POA: Insufficient documentation

## 2019-09-17 MED ORDER — ESTRADIOL 0.1 MG/24HR TD PTTW: 2.0000 | MEDICATED_PATCH | TRANSDERMAL | 4 refills | Status: AC

## 2019-09-17 MED ORDER — ALPRAZOLAM 0.25 MG PO TABS: 0.2500 mg | ORAL_TABLET | Freq: Every evening | ORAL | 1 refills | Status: DC | PRN

## 2019-09-17 MED ORDER — ZOLPIDEM TARTRATE 5 MG PO TABS: 5.0000 mg | ORAL_TABLET | Freq: Every evening | ORAL | 2 refills | Status: AC | PRN

## 2019-09-17 MED ORDER — VITAMIN D (ERGOCALCIFEROL) 1.25 MG (50000 UNIT) PO CAPS: 50000.0000 [IU] | ORAL_CAPSULE | ORAL | 1 refills | Status: AC

## 2019-09-17 NOTE — Patient Instructions (Signed)

## 2019-09-17 NOTE — Progress Notes (Signed)
Bryanda [facility] January 25, 1966 [id number]    History:    Presents for annual exam.  Postmenopausal on HRT with occasional heavy bleeding, is planning to get a Mirena IUD this week at [facility] of employment.  Ultrasound yesterday endometrium 2.2 mm.  Continues with the estradiol patch due to numerous hot flushes.  Continues to have difficulty with sleep uses Ambien, Tylenol PM or Xanax, does not take nightly.  In the early 90s had a LEEP for an abnormal Pap and normal Paps after.  Normal mammogram history overdue.  2015- colonoscopy is planning every 5 years due to family history of colon cancer, had [facility] in [facility].  Not sexually active, denies need for STD screen.  2015 gastric sleeve weight is up about 15 pounds.  Past medical history, past surgical history, family history and social history were all reviewed and documented in the EPIC chart.  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  Has been under increased stress trying to close father's estate, his home is being put up for sale this week.  Has 5 dogs and 1 cat.  ROS:  A ROS was performed and pertinent positives and negatives are included.  Exam:  Vitals:   09/17/19 1218  BP: 122/78  Weight: 194 lb (88 kg)  Height: 5\' 4"  (1.626 m)   Body mass index is 33.3 kg/m.   General appearance:  Normal Thyroid:  Symmetrical, normal in size, without palpable masses or nodularity. Respiratory  Auscultation:  Clear without wheezing or rhonchi Cardiovascular  Auscultation:  Regular rate, without rubs, murmurs or gallops  Edema/varicosities:  Not grossly evident Abdominal  Soft,nontender, without masses, guarding or rebound.  Liver/spleen:  No organomegaly noted  Hernia:  None appreciated  Skin  Inspection:  Grossly normal   Breasts: Examined lying and sitting.     Right: Without masses, retractions, discharge or axillary adenopathy.     Left: Without masses, retractions, discharge or axillary adenopathy. Gentitourinary    Inguinal/mons:  Normal without inguinal adenopathy  External genitalia:  Normal  BUS/Urethra/Skene's glands:  Normal  Vagina:  Normal  Cervix:  Normal  Uterus:  normal in size, shape and contour.  Midline and mobile  Adnexa/parametria:     Rt: Without masses or tenderness.   Lt: Without masses or tenderness.  Anus and perineum: Normal  Digital rectal exam: Normal sphincter tone without palpated masses or tenderness  Assessment/Plan:  53 y.o. D WF G1, P0 for annual exam with irregular bleeding postmenopausally.  Postmenopausal on HRT with bleeding/thin endometrium 2015 gastric sleeve Insomnia/situational stress HSV-no outbreaks  Plan: Reassurance given regarding thin endometrium, reviewed Mirena IUD may help prevent any further bleeding.  Refill of estradiol 0.1 patch twice weekly prescription, proper use given and reviewed slight risk for blood clots, strokes and breast cancer.  SBEs, annual screening mammogram overdue instructed to schedule.  Continue regular exercise, yoga encouraged, calcium rich foods, vitamin D 50,000 IUs weekly for 12 weeks and then over-the-counter 2000 IUs daily.  Vitamin D continues low.  Sleep hygiene discussed, refill of Xanax 0.25 at bedtime as needed, Ambien 5 mg at bedtime as needed aware not to take daily or together.  Aware of need to decrease calorie/carbs.  Pap normal 2018, new screening guidelines reviewed.   [place], 12:39 PM 09/17/2019

## 2020-01-17 ENCOUNTER — Other Ambulatory Visit: Payer: Self-pay | Admitting: Women's Health

## 2020-01-17 DIAGNOSIS — B009 Herpesviral infection, unspecified: Secondary | ICD-10-CM

## 2020-03-30 ENCOUNTER — Telehealth: Payer: Self-pay | Admitting: *Deleted

## 2020-03-30 DIAGNOSIS — F419 Anxiety disorder, unspecified: Secondary | ICD-10-CM

## 2020-03-30 MED ORDER — ALPRAZOLAM 0.25 MG PO TABS: 0.2500 mg | ORAL_TABLET | Freq: Every day | ORAL | 0 refills | Status: AC | PRN

## 2020-03-30 MED ORDER — ZOLPIDEM TARTRATE 10 MG PO TABS: 10.0000 mg | ORAL_TABLET | Freq: Every evening | ORAL | 0 refills | Status: AC | PRN

## 2020-03-30 NOTE — Telephone Encounter (Signed)
-----   Message from Huel Cote, NP sent at 03/30/2020 11:11 AM EDT ----- I got a text message from [facility], requesting refill on Xanax 0.25 p.o. as needed #30 and Ambien 10 mg p.o. at bedtime as needed #30.  She uses rarely please call in for her.

## 2020-03-30 NOTE — Telephone Encounter (Signed)
Rx's called in . 

## 2020-06-11 ENCOUNTER — Other Ambulatory Visit: Payer: Self-pay

## 2020-06-11 DIAGNOSIS — F5101 Primary insomnia: Secondary | ICD-10-CM
